# Patient Record
Sex: Female | Born: 1950 | Race: White | Hispanic: No | Marital: Married | Smoking: Never smoker
Health system: Southern US, Community
[De-identification: ages and names within clinical notes are randomized; demographics above are authoritative.]

## PROBLEM LIST (undated history)

## (undated) DIAGNOSIS — I10 Essential (primary) hypertension: Secondary | ICD-10-CM

## (undated) HISTORY — PX: ABDOMINAL HYSTERECTOMY: SHX81

## (undated) HISTORY — PX: OTHER SURGICAL HISTORY: SHX169

## (undated) HISTORY — DX: Essential (primary) hypertension: I10

## (undated) HISTORY — PX: CHOLECYSTECTOMY: SHX55

## (undated) HISTORY — PX: VESICOVAGINAL FISTULA CLOSURE W/ TAH: SUR271

---

## 2011-10-19 ENCOUNTER — Ambulatory Visit (HOSPITAL_COMMUNITY)
Admission: RE | Admit: 2011-10-19 | Discharge: 2011-10-19 | Disposition: A | Discharge status: Home / Self Care | Payer: BC Managed Care – PPO | Source: Ambulatory Visit | Attending: Family Medicine | Admitting: Family Medicine

## 2011-10-19 DIAGNOSIS — M79609 Pain in unspecified limb: Secondary | ICD-10-CM | POA: Insufficient documentation

## 2011-10-19 DIAGNOSIS — M7989 Other specified soft tissue disorders: Secondary | ICD-10-CM | POA: Insufficient documentation

## 2011-10-19 DIAGNOSIS — M79606 Pain in leg, unspecified: Secondary | ICD-10-CM

## 2011-10-19 NOTE — Progress Notes (Signed)
*  PRELIMINARY RESULTS* Vascular Ultrasound Left lower extremity venous duplex has been completed.  Preliminary findings: Left= No evidence of DVT. Incidental finding of Baker's cyst.  Farrel Demark, RDMS     10/19/2011, 4:31 PM

## 2011-11-02 ENCOUNTER — Other Ambulatory Visit: Payer: Self-pay | Admitting: Family Medicine

## 2011-11-02 DIAGNOSIS — R921 Mammographic calcification found on diagnostic imaging of breast: Secondary | ICD-10-CM

## 2011-11-25 ENCOUNTER — Ambulatory Visit
Admission: RE | Admit: 2011-11-25 | Discharge: 2011-11-25 | Disposition: A | Discharge status: Home / Self Care | Payer: 59 | Source: Ambulatory Visit | Attending: Family Medicine | Admitting: Family Medicine

## 2011-11-25 DIAGNOSIS — R921 Mammographic calcification found on diagnostic imaging of breast: Secondary | ICD-10-CM

## 2012-04-18 ENCOUNTER — Emergency Department (HOSPITAL_COMMUNITY)
Admission: EM | Admit: 2012-04-18 | Discharge: 2012-04-18 | Disposition: A | Discharge status: Home / Self Care | Payer: BC Managed Care – PPO | Attending: Emergency Medicine | Admitting: Emergency Medicine

## 2012-04-18 ENCOUNTER — Emergency Department (HOSPITAL_COMMUNITY): Payer: BC Managed Care – PPO

## 2012-04-18 ENCOUNTER — Encounter (HOSPITAL_COMMUNITY): Payer: Self-pay | Admitting: *Deleted

## 2012-04-18 DIAGNOSIS — S6992XA Unspecified injury of left wrist, hand and finger(s), initial encounter: Secondary | ICD-10-CM

## 2012-04-18 DIAGNOSIS — W19XXXA Unspecified fall, initial encounter: Secondary | ICD-10-CM | POA: Insufficient documentation

## 2012-04-18 DIAGNOSIS — Y998 Other external cause status: Secondary | ICD-10-CM | POA: Insufficient documentation

## 2012-04-18 DIAGNOSIS — IMO0002 Reserved for concepts with insufficient information to code with codable children: Secondary | ICD-10-CM

## 2012-04-18 DIAGNOSIS — S61409A Unspecified open wound of unspecified hand, initial encounter: Secondary | ICD-10-CM | POA: Insufficient documentation

## 2012-04-18 DIAGNOSIS — Y93K1 Activity, walking an animal: Secondary | ICD-10-CM | POA: Insufficient documentation

## 2012-04-18 MED ORDER — SULFAMETHOXAZOLE-TRIMETHOPRIM 800-160 MG PO TABS: 1.0000 | ORAL_TABLET | Freq: Two times a day (BID) | ORAL | Status: DC

## 2012-04-18 MED ORDER — TETANUS-DIPHTH-ACELL PERTUSSIS 5-2.5-18.5 LF-MCG/0.5 IM SUSP
0.5000 mL | Freq: Once | INTRAMUSCULAR | Status: AC
  Administered 2012-04-18: 0.5 mL via INTRAMUSCULAR
  Filled 2012-04-18: qty 0.5

## 2012-04-18 NOTE — ED Notes (Signed)
Patient given discharge instructions, information, prescriptions, and diet order. Patient states that they adequately understand discharge information given and to return to ED if symptoms return or worsen.     

## 2012-04-18 NOTE — ED Provider Notes (Addendum)
History     CSN: [id number]  Arrival date & time 04/18/12  1657   First MD Initiated Contact with Patient 04/18/12 1705      Chief Complaint  Patient presents with  . Hand Injury    (Consider location/radiation/quality/duration/timing/severity/associated sxs/prior treatment) HPI Comments: Patient presents with left hand injury after fall while walking three dogs. Patient states that when she fell she cut/landed on her left hand. Denies head trauma or LOC. Patient cannot recall her last tetanus shot. Denies join pain or numbness. Denies use of ASA or blood thinners. Denies other injuries or pain.  The history is provided by the patient. No language interpreter was used.    History reviewed. No pertinent past medical history.  Past Surgical History  Procedure Date  . Vesicovaginal fistula closure w/ tah   . Cesearan   . Cholecystectomy     No family history on file.  History  Substance Use Topics  . Smoking status: Not on file  . Smokeless tobacco: Not on file  . Alcohol Use:     OB History    Grav Para Term Preterm Abortions TAB SAB Ect Mult Living                  Review of Systems  Musculoskeletal: Negative for joint swelling and arthralgias.  Skin: Positive for wound.  Neurological: Negative for numbness.    Allergies  Review of patient's allergies indicates no known allergies.  Home Medications   Current Outpatient Rx  Name Route Sig Dispense Refill  . ADULT MULTIVITAMIN W/MINERALS CH Oral Take 1 tablet by mouth daily.      BP 137/91  Pulse 90  Temp 98.5 F (36.9 C) (Oral)  Resp 16  SpO2 95%  Physical Exam  Nursing note and vitals reviewed. Constitutional: She appears well-developed and well-nourished. No distress.  HENT:  Head: Normocephalic and atraumatic.  Mouth/Throat: Oropharynx is clear and moist.  Eyes: Conjunctivae normal and EOM are normal. No scleral icterus.  Neck: Normal range of motion. Neck supple.  Cardiovascular: Normal  rate, regular rhythm and normal heart sounds.   Pulmonary/Chest: Breath sounds normal.  Abdominal: Soft. Bowel sounds are normal. There is no tenderness.  Musculoskeletal: She exhibits edema. She exhibits no tenderness.       There is mild swelling/hematoma on the lateral portion of the left hand. Good range of motion. Strong radial pulse and good sensation.  Neurological: She is alert.  Skin: Skin is warm and dry.       There is a superficial .5 cm laceration on the ventral aspect of the left palm under the 5th digit.   LACERATION REPAIR Performed by: Pixie Casino Authorized by: Pixie Casino Consent: Verbal consent obtained. Risks and benefits: risks, benefits and alternatives were discussed Consent given by: patient Patient identity confirmed: provided demographic data Prepped and Draped in normal sterile fashion Wound explored  Laceration Location: ventral aspect of left palm  Laceration Length: 1/2 cm  No Foreign Bodies seen or palpated  Anesthesia: local infiltration  Local anesthetic: none  Anesthetic total: 0 ml  Irrigation method: syringe Amount of cleaning: standard  Skin closure: well approximated  Number of sutures: 0  Technique: dermabond used to close superficial laceration  Patient tolerance: Patient tolerated the procedure well with no immediate complications.  ED Course  Procedures (including critical care time)  Labs Reviewed - No data to display Dg Hand Complete Left  04/18/2012  *RADIOLOGY REPORT*  Clinical Data: Fall.  Pain and  swelling over the fifth metacarpal area.  LEFT HAND - COMPLETE 3+ VIEW  Comparison: None.  Findings: Soft tissue swelling is present over the dorsal and ulnar aspect of the hand at the level of the fifth metacarpal.  There is no fracture or dislocation.  The wrist is located.  IMPRESSION: Soft tissue swelling over the dorsal and ulnar aspect of the hand without an underlying fracture.   Original Report Authenticated By:  Jamesetta Orleans. MATTERN, M.D.      1. Injury of left hand   2. Laceration       MDM  Patient presented s/p fall with laceration and swelling. Patient did not report pain and declined pain medication. Imaging unremarkable for fracture or dislocation. Tetanus updated, laceration irrigated, cleaned, and dermabonded. Patient discharged with return precautions, ABX, and instructions on dermabond after care.       Pixie Casino, PA-C 04/18/12 2022  Pixie Casino, PA-C 05/14/12 1626

## 2012-04-18 NOTE — ED Notes (Signed)
Pt present with c/o left hand injury. Pt sts she was walking 3 dogs and fell onto her left hand. Puncture wound to left hand and swelling to back of left hand. Small abrasion to left forehead, above eye. Pt denies LOC. Pt sts she is unsure of her last tetanus shot.

## 2012-04-22 NOTE — ED Provider Notes (Signed)
Medical screening examination/treatment/procedure(s) were performed by non-physician practitioner and as supervising physician I was immediately available for consultation/collaboration.  Chirsty Armistead T Rhonna Holster, MD 04/22/12 1014 

## 2012-05-17 NOTE — ED Provider Notes (Signed)
Medical screening examination/treatment/procedure(s) were performed by non-physician practitioner and as supervising physician I was immediately available for consultation/collaboration.  Toy Baker, MD 05/17/12 [phone]

## 2013-01-01 HISTORY — PX: KNEE SURGERY: SHX244

## 2013-08-02 ENCOUNTER — Emergency Department (HOSPITAL_COMMUNITY)
Admission: EM | Admit: 2013-08-02 | Discharge: 2013-08-02 | Disposition: A | Discharge status: Home / Self Care | Payer: BC Managed Care – PPO | Attending: Emergency Medicine | Admitting: Emergency Medicine

## 2013-08-02 ENCOUNTER — Emergency Department (HOSPITAL_COMMUNITY): Payer: BC Managed Care – PPO

## 2013-08-02 ENCOUNTER — Encounter (HOSPITAL_COMMUNITY): Payer: Self-pay | Admitting: Emergency Medicine

## 2013-08-02 DIAGNOSIS — Z79899 Other long term (current) drug therapy: Secondary | ICD-10-CM | POA: Insufficient documentation

## 2013-08-02 DIAGNOSIS — R0789 Other chest pain: Secondary | ICD-10-CM | POA: Insufficient documentation

## 2013-08-02 DIAGNOSIS — R42 Dizziness and giddiness: Secondary | ICD-10-CM | POA: Insufficient documentation

## 2013-08-02 DIAGNOSIS — E669 Obesity, unspecified: Secondary | ICD-10-CM | POA: Insufficient documentation

## 2013-08-02 DIAGNOSIS — R079 Chest pain, unspecified: Secondary | ICD-10-CM

## 2013-08-02 DIAGNOSIS — Z7982 Long term (current) use of aspirin: Secondary | ICD-10-CM | POA: Insufficient documentation

## 2013-08-02 LAB — COMPREHENSIVE METABOLIC PANEL WITH GFR
ALT: 14 U/L (ref 0–35)
AST: 17 U/L (ref 0–37)
Albumin: 3.5 g/dL (ref 3.5–5.2)
Alkaline Phosphatase: 91 U/L (ref 39–117)
BUN: 13 mg/dL (ref 6–23)
CO2: 26 meq/L (ref 19–32)
Calcium: 9.5 mg/dL (ref 8.4–10.5)
Chloride: 100 meq/L (ref 96–112)
Creatinine, Ser: 0.66 mg/dL (ref 0.50–1.10)
GFR calc Af Amer: >90 mL/min
GFR calc non Af Amer: >90 mL/min
Glucose, Bld: 178 mg/dL — ABNORMAL HIGH (ref 70–99)
Potassium: 3.8 meq/L (ref 3.7–5.3)
Sodium: 139 meq/L (ref 137–147)
Total Bilirubin: 0.3 mg/dL (ref 0.3–1.2)
Total Protein: 6.9 g/dL (ref 6.0–8.3)

## 2013-08-02 LAB — CBC
HCT: 44.6 % (ref 36.0–46.0)
HEMOGLOBIN: 15.1 g/dL — AB (ref 12.0–15.0)
MCH: 29.8 pg (ref 26.0–34.0)
MCHC: 33.9 g/dL (ref 30.0–36.0)
MCV: 88 fL (ref 78.0–100.0)
PLATELETS: 319 10*3/uL (ref 150–400)
RBC: 5.07 MIL/uL (ref 3.87–5.11)
RDW: 12.5 % (ref 11.5–15.5)
WBC: 6.6 10*3/uL (ref 4.0–10.5)

## 2013-08-02 LAB — APTT: aPTT: 26 s (ref 24–37)

## 2013-08-02 LAB — POCT I-STAT TROPONIN I
TROPONIN I, POC: 0 ng/mL (ref 0.00–0.08)
Troponin i, poc: 0 ng/mL (ref 0.00–0.08)

## 2013-08-02 LAB — PROTIME-INR
INR: 0.88 (ref 0.00–1.49)
Prothrombin Time: 11.8 seconds (ref 11.6–15.2)

## 2013-08-02 MED ORDER — PANTOPRAZOLE SODIUM 20 MG PO TBEC: 20.0000 mg | DELAYED_RELEASE_TABLET | Freq: Every day | ORAL | Status: DC

## 2013-08-02 MED ORDER — GI COCKTAIL ~~LOC~~
30.0000 mL | Freq: Once | ORAL | Status: AC
  Administered 2013-08-02: 30 mL via ORAL
  Filled 2013-08-02: qty 30

## 2013-08-02 MED ORDER — ASPIRIN 325 MG PO TABS: 325.0000 mg | ORAL_TABLET | Freq: Every day | ORAL | Status: AC

## 2013-08-02 MED ORDER — SODIUM CHLORIDE 0.9 % IV SOLN
1000.0000 mL | INTRAVENOUS | Status: DC
  Administered 2013-08-02: 1000 mL via INTRAVENOUS

## 2013-08-02 NOTE — ED Notes (Signed)
Pt reports that she was having a conversation on Tuesday and everything started to sway in her vision while sitting still, pt reports that the dizziness has continued intermittently. Pt states that today she began to have centralized chest pressure, denies ShOB, states that she had similar episodes in the past and had MRIs that were negative. Pt a&o x4, ambulatory to triage.

## 2013-08-02 NOTE — Discharge Instructions (Signed)
Chest Pain (Nonspecific) °It is often hard to give a specific diagnosis for the cause of chest pain. There is always a chance that your pain could be related to something serious, such as a heart attack or a blood clot in the lungs. You need to follow up with your caregiver for further evaluation. °CAUSES  °· Heartburn. °· Pneumonia or bronchitis. °· Anxiety or stress. °· Inflammation around your heart (pericarditis) or lung (pleuritis or pleurisy). °· A blood clot in the lung. °· A collapsed lung (pneumothorax). It can develop suddenly on its own (spontaneous pneumothorax) or from injury (trauma) to the chest. °· Shingles infection (herpes zoster virus). °The chest wall is composed of bones, muscles, and cartilage. Any of these can be the source of the pain. °· The bones can be bruised by injury. °· The muscles or cartilage can be strained by coughing or overwork. °· The cartilage can be affected by inflammation and become sore (costochondritis). °DIAGNOSIS  °Lab tests or other studies, such as X-rays, electrocardiography, stress testing, or cardiac imaging, may be needed to find the cause of your pain.  °TREATMENT  °· Treatment depends on what may be causing your chest pain. Treatment may include: °· Acid blockers for heartburn. °· Anti-inflammatory medicine. °· Pain medicine for inflammatory conditions. °· Antibiotics if an infection is present. °· You may be advised to change lifestyle habits. This includes stopping smoking and avoiding alcohol, caffeine, and chocolate. °· You may be advised to keep your head raised (elevated) when sleeping. This reduces the chance of acid going backward from your stomach into your esophagus. °· Most of the time, nonspecific chest pain will improve within 2 to 3 days with rest and mild pain medicine. °HOME CARE INSTRUCTIONS  °· If antibiotics were prescribed, take your antibiotics as directed. Finish them even if you start to feel better. °· For the next few days, avoid physical  activities that bring on chest pain. Continue physical activities as directed. °· Do not smoke. °· Avoid drinking alcohol. °· Only take over-the-counter or prescription medicine for pain, discomfort, or fever as directed by your caregiver. °· Follow your caregiver's suggestions for further testing if your chest pain does not go away. °· Keep any follow-up appointments you made. If you do not go to an appointment, you could develop lasting (chronic) problems with pain. If there is any problem keeping an appointment, you must call to reschedule. °SEEK MEDICAL CARE IF:  °· You think you are having problems from the medicine you are taking. Read your medicine instructions carefully. °· Your chest pain does not go away, even after treatment. °· You develop a rash with blisters on your chest. °SEEK IMMEDIATE MEDICAL CARE IF:  °· You have increased chest pain or pain that spreads to your arm, neck, jaw, back, or abdomen. °· You develop shortness of breath, an increasing cough, or you are coughing up blood. °· You have severe back or abdominal pain, feel nauseous, or vomit. °· You develop severe weakness, fainting, or chills. °· You have a fever. °THIS IS AN EMERGENCY. Do not wait to see if the pain will go away. Get medical help at once. Call your local emergency services (911 in [place] Do not drive yourself to the hospital. °MAKE SURE YOU:  °· Understand these instructions. °· Will watch your condition. °· Will get help right away if you are not doing well or get worse. °Document Released: 03/30/2005 Document Revised: 09/12/2011 Document Reviewed: 01/24/2008 °ExitCare® Patient Information ©2014 [employer]   [employer]. ° °

## 2013-08-02 NOTE — ED Provider Notes (Signed)
CSN: [id number]     Arrival date & time 08/02/13  4098 History   First MD Initiated Contact with Patient 08/02/13 [phone]     Chief Complaint  Patient presents with  . Chest Pain    Patient is a 63 y.o. female presenting with chest pain. The history is provided by the patient.  Chest Pain Pain location:  Substernal area Pain quality: pressure   Pain radiates to:  Does not radiate Pain radiates to the back: no   Pain severity:  Mild Onset quality: She noticed it when she woke up this morning. Timing:  Intermittent Progression:  Waxing and waning Chronicity:  New Relieved by:  Nothing Exacerbated by: not increased by movement, breathing. Ineffective treatments:  Aspirin Associated symptoms: dizziness (On and off the past few days)   Associated symptoms: no abdominal pain, no cough, no diaphoresis, no fever, no nausea, no near-syncope and no shortness of breath   Risk factors: no coronary artery disease, no high cholesterol, no hypertension, no prior DVT/PE and no smoking   Risk factors comment:  Brother MI in his 66s, deceased Pt did have a stress test a few years ago that was normal.  It was done after her brother died of an MI. She does not exercise regularly but has not had noticed any trouble walking up and down stairs.  History reviewed. No pertinent past medical history. Past Surgical History  Procedure Laterality Date  . Vesicovaginal fistula closure w/ tah    . Cesearan    . Cholecystectomy    . Abdominal hysterectomy    . Knee surgery Left 01/2013    Torn Meniscus   History reviewed. No pertinent family history. History  Substance Use Topics  . Smoking status: Never Smoker   . Smokeless tobacco: Never Used  . Alcohol Use: No   OB History   Grav Para Term Preterm Abortions TAB SAB Ect Mult Living                 Review of Systems  Constitutional: Negative for fever and diaphoresis.  Respiratory: Negative for cough and shortness of breath.   Cardiovascular:  Positive for chest pain. Negative for near-syncope.  Gastrointestinal: Negative for nausea and abdominal pain.  Neurological: Positive for dizziness (On and off the past few days).  All other systems reviewed and are negative.    Allergies  Review of patient's allergies indicates no known allergies.  Home Medications   Current Outpatient Rx  Name  Route  Sig  Dispense  Refill  . Multiple Vitamin (MULTIVITAMIN WITH MINERALS) TABS   Oral   Take 1 tablet by mouth daily.         Marland Kitchen aspirin 325 MG tablet   Oral   Take 1 tablet (325 mg total) by mouth daily.   30 tablet   0   . pantoprazole (PROTONIX) 20 MG tablet   Oral   Take 1 tablet (20 mg total) by mouth daily.   14 tablet   0    BP 154/82  Pulse 74  Temp(Src) 98 F (36.7 C) (Oral)  Resp 12  Ht 5\' 6"  (1.676 m)  Wt 245 lb (111.131 kg)  BMI 39.56 kg/m2  SpO2 94% Physical Exam  Nursing note and vitals reviewed. Constitutional: She appears well-developed and well-nourished. No distress.  Obese   HENT:  Head: Normocephalic and atraumatic.  Right Ear: External ear normal.  Left Ear: External ear normal.  Eyes: Conjunctivae are normal. Right eye exhibits no  discharge. Left eye exhibits no discharge. No scleral icterus.  Neck: Neck supple. No tracheal deviation present.  Cardiovascular: Normal rate, regular rhythm and intact distal pulses.   Pulmonary/Chest: Effort normal and breath sounds normal. No stridor. No respiratory distress. She has no wheezes. She has no rales.  Abdominal: Soft. Bowel sounds are normal. She exhibits no distension. There is no tenderness. There is no rebound and no guarding.  Musculoskeletal: She exhibits no edema and no tenderness.  Neurological: She is alert. She has normal strength. No cranial nerve deficit (no facial droop, extraocular movements intact, no slurred speech) or sensory deficit. She exhibits normal muscle tone. She displays no seizure activity. Coordination normal.  Skin: Skin  is warm and dry. No rash noted.  Psychiatric: She has a normal mood and affect.    ED Course  Procedures (including critical care time) Labs Review Labs Reviewed  CBC - Abnormal; Notable for the following:    Hemoglobin 15.1 (*)    All other components within normal limits  COMPREHENSIVE METABOLIC PANEL - Abnormal; Notable for the following:    Glucose, Bld 178 (*)    All other components within normal limits  PROTIME-INR  APTT  POCT I-STAT TROPONIN I  POCT I-STAT TROPONIN I   Imaging Review Dg Chest 2 View  08/02/2013   CLINICAL DATA:  Two day history of dizziness and chest discomfort  EXAM: CHEST  2 VIEW  COMPARISON:  None.  FINDINGS: The lungs are well-expanded and clear. The cardiac silhouette is mildly enlarged. The pulmonary vascularity is prominent centrally but there is no cephalization of the pulmonary vascular pattern. No pulmonary interstitial or alveolar edema is demonstrated. There is no pleural effusion or pneumothorax. There is tortuosity of the descending thoracic aorta. The observed portions of the bony thorax appear normal.  IMPRESSION: There is no evidence of pneumonia nor CHF or other acute cardiopulmonary disease.   Electronically Signed   By: David  Swazil[place]   On: 08/02/2013 10:50   (verbal report normal)  EKG Interpretation    Date/Time:  Friday August 02 2013 07:05:40 EST Ventricular Rate:  98 PR Interval:  182 QRS Duration: 80 QT Interval:  368 QTC Calculation: 470 R Axis:   -37 Text Interpretation:  Sinus rhythm poor r wave progression No previous tracing Confirmed by Wille Aubuchon  MD-J, Gavrielle Streck ([id number]) on 08/02/2013 7:13:20 AM           Medications  0.9 %  sodium chloride infusion (1,000 mLs Intravenous New Bag/Given 08/02/13 0802)  gi cocktail (Maalox,Lidocaine,Donnatal) (30 mLs Oral Given 08/02/13 0802)    1056  Feeling better.  No pain or pressure. MDM   1. Chest pain     Suspect her symptoms are related to GERD.   TIMI score zero.  Pt has had two  sets enzymes, no cardiac risk factors except family history.  Pt has had negative stress test previously but was several years ago.  Discussed hospitalization vs outpatient follow up and probable repeat stress testing.  Pt comfortable with outpatient follow up.  Will have her take an aspirin  Daily and rx ppi.  Instructed to return to the ED for recurrent symptoms.   Celene KrasJon R Isaiah Cianci, MD 08/02/13 [phone]

## 2013-08-21 ENCOUNTER — Ambulatory Visit: Payer: BC Managed Care – PPO | Admitting: Physician Assistant

## 2013-09-24 ENCOUNTER — Ambulatory Visit: Payer: BC Managed Care – PPO | Admitting: Physician Assistant

## 2013-11-26 ENCOUNTER — Other Ambulatory Visit: Payer: Self-pay

## 2013-11-26 DIAGNOSIS — Z1231 Encounter for screening mammogram for malignant neoplasm of breast: Secondary | ICD-10-CM

## 2013-12-13 ENCOUNTER — Ambulatory Visit
Admission: RE | Admit: 2013-12-13 | Discharge: 2013-12-13 | Disposition: A | Discharge status: Home / Self Care | Payer: Managed Care, Other (non HMO) | Source: Ambulatory Visit

## 2013-12-13 DIAGNOSIS — Z1231 Encounter for screening mammogram for malignant neoplasm of breast: Secondary | ICD-10-CM

## 2014-07-31 ENCOUNTER — Telehealth: Payer: Self-pay | Admitting: Internal Medicine

## 2014-07-31 NOTE — Telephone Encounter (Signed)
[facility] (Dr Virl Sonammy Boyd) for appointment on 08/21/14 with Dr Rennis GoldenHilty.  [facility] for Dr Blanchie DessertHilty's schedule on 08/21/14.  lp

## 2014-08-21 ENCOUNTER — Encounter: Payer: Self-pay | Admitting: Internal Medicine

## 2014-08-21 ENCOUNTER — Ambulatory Visit (INDEPENDENT_AMBULATORY_CARE_PROVIDER_SITE_OTHER): Payer: BLUE CROSS/BLUE SHIELD | Admitting: Internal Medicine

## 2014-08-21 VITALS — BP 138/96 | HR 82 | Ht 67.0 in | Wt 255.9 lb

## 2014-08-21 DIAGNOSIS — R002 Palpitations: Secondary | ICD-10-CM | POA: Diagnosis not present

## 2014-08-21 DIAGNOSIS — Z8249 Family history of ischemic heart disease and other diseases of the circulatory system: Secondary | ICD-10-CM

## 2014-08-21 DIAGNOSIS — I10 Essential (primary) hypertension: Secondary | ICD-10-CM | POA: Insufficient documentation

## 2014-08-21 DIAGNOSIS — R9431 Abnormal electrocardiogram [ECG] [EKG]: Secondary | ICD-10-CM

## 2014-08-21 DIAGNOSIS — R0609 Other forms of dyspnea: Secondary | ICD-10-CM

## 2014-08-21 NOTE — Progress Notes (Signed)
OFFICE NOTE  Chief Complaint:  Palpitations, shortness of breath  Primary Care Physician: Verlon AuBoyd, Tammy Lamonica, MD  HPI:  Stephanie Barnett is a pleasant 64 year old female with past medical history significant for hypertension as well as a family history of heart disease whose brother died of a heart attack at age 64. She has coronary risk factors that also include morbid obesity with a BMI of 40. Recently she's been having some worsening palpitations or feeling of fluttering in her chest. She says it can occur almost any time and is not sterilely worse with exertion or relieved by rest. She does have a history of reflux. An EKG was performed in the office today which is abnormal demonstrating poor R-wave progression across the precordium as well as Q waves inferiorly into 3 and aVF concerning for either anterior or inferior infarcts. She denies any history of heart disease or prior heart attack. She said that [place], probably more than 10 years ago.  PMHx:  Past Medical History  Diagnosis Date  . Hypertension     Past Surgical History  Procedure Laterality Date  . Vesicovaginal fistula closure w/ tah    . Cesearan    . Cholecystectomy    . Abdominal hysterectomy    . Knee surgery Left 01/2013    Torn Meniscus    FAMHx:  Family History  Problem Relation Age of Onset  . Heart attack Brother 56  . Pulmonary embolism Father     COD after total hip replacment  . Heart disease Paternal Grandfather   . Heart disease Paternal Grandmother   . Hypertension Mother   . Arthritis/Rheumatoid Brother     SOCHx:   reports that she has never smoked. She has never used smokeless tobacco. She reports that she does not drink alcohol or use illicit drugs.  ALLERGIES:  No Known Allergies  ROS: A comprehensive review of systems was negative except for: Cardiovascular: positive for dyspnea and palpitations  HOME  MEDS: Current Outpatient Prescriptions  Medication Sig Dispense Refill  . aspirin 325 MG tablet Take 1 tablet (325 mg total) by mouth daily. 30 tablet 0  . losartan-hydrochlorothiazide (HYZAAR) 50-12.5 MG per tablet Take 1 tablet by mouth daily.    . Multiple Vitamin (MULTIVITAMIN WITH MINERALS) TABS Take 1 tablet by mouth daily.     No current facility-administered medications for this visit.    LABS/IMAGING: No results found for this or any previous visit (from the past 48 hour(s)). No results found.  VITALS: BP 138/96 mmHg  Pulse 82  Ht 5\' 7"  (1.702 m)  Wt 255 lb 14.4 oz (116.075 kg)  BMI 40.07 kg/m2  EXAM: General appearance: alert, no distress and morbidly obese Neck: no carotid bruit and no JVD Lungs: clear to auscultation bilaterally Heart: regular rate and rhythm, S1, S2 normal, no murmur, click, rub or gallop Abdomen: soft, non-tender; bowel sounds normal; no masses,  no organomegaly Extremities: extremities normal, atraumatic, no cyanosis or edema Pulses: 2+ and symmetric Skin: Skin color, texture, turgor normal. No rashes or lesions Neurologic: Grossly normal Psych: Normal  EKG: Normal sinus rhythm at 82, possible inferior and anterior infarcts  ASSESSMENT: 1. Dyspnea and exertion 2. Palpitations 3. Abnormal EKG with possible history of prior infarcts 4. Morbid obesity 5. Hypertension 6. Family history of premature coronary disease   PLAN: 1.   Stephanie Barnett has a number of cardiac risk factors including strong family history  of coronary disease, hypertension and morbid obesity. Her EKG is abnormal suggesting possible prior inferior and anterior infarcts, however she has no recollection of these events and no known coronary disease. She's had remote stress testing more than 10 years ago which is not relevant at this point. She is also describing palpitations. I would recommend we fitted her with a monitor for 2 weeks as her symptoms occur several times a week.  This will help [place] to determine the cause of her palpitations. I also recommend a nuclear stress test to evaluate for ischemia and/or prior infarct. Plan to see her back to discuss results of the studies in a few weeks.   Thanks for the kind referral.   Chrystie Nose, MD, [facility] Attending Cardiologist [facility]  Celso Granja C 08/21/2014, 12:47 PM

## 2014-08-21 NOTE — Patient Instructions (Signed)
Your physician has requested that you have an exercise stress myoview. For further information please visit [email or web address]. Please follow instruction sheet, as given.  Your physician recommends that you schedule a follow-up appointment after your stress test & monitor.   Your Doctor has ordered you to wear a heart monitor. You will wear this for 14 days.   TIPS -  REMINDERS 1. The sensor is the lanyard that is worn around your neck every day - this is powered by a battery that needs to be changed every day 2. The monitor is the device that allows you to record symptoms - this will need to be charged daily 3. The sensor & monitor need to be within 100 feet of each other at all times 4. The sensor connects to the electrodes (stickers) - these should be changed every 24-48 hours (you do not have to remove them when you bathe, just make sure they are dry when you connect it back to the sensor 5. If you need more supplies (electrodes, batteries), please call the 1-800 # on the back of the pamphlet and [employer] will mail you more supplies 6. If your skin becomes sensitive, please try the sample pack of sensitive skin electrodes (the white packet in your silver box) and call [employer] to have them mail you more of these type of electrodes 7. When you are finish wearing the monitor, please place all supplies back in the silver box, place the silver box in the pre-packaged UPS bag and drop off at [employer] or call them so they can come pick it up   Cardiac Event Monitoring A cardiac event monitor is a small recording device used to help detect abnormal heart rhythms (arrhythmias). The monitor is used to record heart rhythm when noticeable symptoms such as the following occur:  Fast heartbeats (palpitations), such as heart racing or fluttering.  Dizziness.  Fainting or light-headedness.  Unexplained weakness. The monitor is wired to two electrodes placed on your chest. Electrodes are flat, sticky  disks that attach to your skin. The monitor can be worn for up to 30 days. You will wear the monitor at all times, except when bathing.  HOW TO USE YOUR CARDIAC EVENT MONITOR A technician will prepare your chest for the electrode placement. The technician will show you how to place the electrodes, how to work the monitor, and how to replace the batteries. Take time to practice using the monitor before you leave the office. Make sure you understand how to send the information from the monitor to your health care provider. This requires a telephone with a landline, not a cell phone. You need to:  Wear your monitor at all times, except when you are in water:  Do not get the monitor wet.  Take the monitor off when bathing. Do not swim or use a hot tub with it on.  Keep your skin clean. Do not put body lotion or moisturizer on your chest.  Change the electrodes daily or any time they stop sticking to your skin. You might need to use tape to keep them on.  It is possible that your skin under the electrodes could become irritated. To keep this from happening, try to put the electrodes in slightly different places on your chest. However, they must remain in the area under your left breast and in the upper right section of your chest.  Make sure the monitor is safely clipped to your clothing or in a location close to your body  that your health care provider recommends.  Press the button to record when you feel symptoms of heart trouble, such as dizziness, weakness, light-headedness, palpitations, thumping, shortness of breath, unexplained weakness, or a fluttering or racing heart. The monitor is always on and records what happened slightly before you pressed the button, so do not worry about being too late to get good information.  Keep a diary of your activities, such as walking, doing chores, and taking medicine. It is especially important to note what you were doing when you pushed the button to record  your symptoms. This will help your health care provider determine what might be contributing to your symptoms. The information stored in your monitor will be reviewed by your health care provider alongside your diary entries.  Send the recorded information as recommended by your health care provider. It is important to understand that it will take some time for your health care provider to process the results.  Change the batteries as recommended by your health care provider. SEEK IMMEDIATE MEDICAL CARE IF:   You have chest pain.  You have extreme difficulty breathing or shortness of breath.  You develop a very fast heartbeat that persists.  You develop dizziness that does not go away.  You faint or constantly feel you are about to faint. Document Released: 03/29/2008 Document Revised: 11/04/2013 Document Reviewed: 12/17/2012 [facility] Patient Information 2015 [place], [place]. This information is not intended to replace advice given to you by your health care provider. Make sure you discuss any questions you have with your health care provider.

## 2014-09-02 ENCOUNTER — Telehealth (HOSPITAL_COMMUNITY): Payer: Self-pay

## 2014-09-02 NOTE — Telephone Encounter (Signed)
Encounter complete. 

## 2014-09-04 ENCOUNTER — Ambulatory Visit (HOSPITAL_COMMUNITY)
Admission: RE | Admit: 2014-09-04 | Discharge: 2014-09-04 | Disposition: A | Discharge status: Home / Self Care | Payer: BLUE CROSS/BLUE SHIELD | Source: Ambulatory Visit | Attending: Cardiology | Admitting: Cardiology

## 2014-09-04 DIAGNOSIS — R002 Palpitations: Secondary | ICD-10-CM | POA: Insufficient documentation

## 2014-09-04 DIAGNOSIS — I10 Essential (primary) hypertension: Secondary | ICD-10-CM

## 2014-09-04 DIAGNOSIS — R9431 Abnormal electrocardiogram [ECG] [EKG]: Secondary | ICD-10-CM

## 2014-09-04 DIAGNOSIS — E669 Obesity, unspecified: Secondary | ICD-10-CM | POA: Insufficient documentation

## 2014-09-04 DIAGNOSIS — R0609 Other forms of dyspnea: Secondary | ICD-10-CM | POA: Diagnosis not present

## 2014-09-04 DIAGNOSIS — Z8249 Family history of ischemic heart disease and other diseases of the circulatory system: Secondary | ICD-10-CM | POA: Insufficient documentation

## 2014-09-04 DIAGNOSIS — R42 Dizziness and giddiness: Secondary | ICD-10-CM | POA: Diagnosis not present

## 2014-09-04 MED ORDER — TECHNETIUM TC 99M SESTAMIBI GENERIC - CARDIOLITE
31.9000 | Freq: Once | INTRAVENOUS | Status: AC | PRN
  Administered 2014-09-04: 31.9 via INTRAVENOUS

## 2014-09-04 NOTE — Procedures (Addendum)
[place] [facility] [address] [place] [id number] [phone]  Cardiology Nuclear Med Study  Hoy FinlayDorothy Barnett is a 64 y.o. female     MRN : [id number]     DOB: 11/14/1950  Procedure Date: 09/04/2014  Nuclear Med Background Indication for Stress Test:  Evaluation for Ischemia and Abnormal EKG History:  No prior cardiac or respiratory history reported;No prior NUC MPI for comparison. Cardiac Risk Factors: Family History - CAD, Hypertension and Obesity  Symptoms:  Dizziness, DOE, Light-Headedness and Palpitations   Nuclear Pre-Procedure Caffeine/Decaff Intake:  7:00pm NPO After: 5:00am   IV Site: R Wrist  IV 0.9% NS with Angio Cath:  22g  Chest Size (in):  n/a IV Started by: Berdie OgrenAmanda Wease, RN  Height: 5\' 7"  (1.702 m)  Cup Size: F  BMI:  Body mass index is 39.93 kg/(m^2). Weight:  255 lb (115.667 kg)   Tech Comments:  n/a    Nuclear Med Study 1 or 2 day study: 2 day  Stress Test Type:  Lexiscan  Order Authorizing Provider:  Zoila ShutterKenneth Hays Dunnigan, MD   Resting Radionuclide: Technetium 2578m Sestamibi  Resting Radionuclide Dose: 31.9 mCi   Stress Radionuclide:  Technetium 8978m Sestamibi  Stress Radionuclide Dose: 32.0 mCi           Stress Protocol Rest HR: 96 Stress HR: 146  Rest BP: 166/101 Stress BP: 212/106  Exercise Time (min): 5:16 METS: 7.0   Predicted Max HR: 157 bpm % Max HR: 92.99 bpm Rate Pressure Product: [id number]  Dose of Adenosine (mg):  n/a Dose of Lexiscan: n/a mg  Dose of Atropine (mg): n/a Dose of Dobutamine: n/a mcg/kg/min (at max HR)  Stress Test Technologist: Esperanza Sheetserry-Marie Martin, CCT Nuclear Technologist: Gonzella LexPam Phillips, CNMT   Rest Procedure:  Myocardial perfusion imaging was performed at rest 45 minutes following the intravenous administration of Technetium 3278m Sestamibi. Stress Procedure:  The patient performed treadmill exercise using a Bruce  Protocol for 5:16 minutes. The patient stopped due to SOB and denied any  chest pain.  There were no significant ST-T wave changes.  Technetium 2578m Sestamibi was injected IV at peak exercise and myocardial perfusion imaging was performed after a brief delay.  Transient Ischemic Dilatation (Normal <1.22):  0.72  QGS EDV:  51 ml QGS ESV:  12 ml LV Ejection Fraction: 76%  Rest ECG: NSR - Normal EKG  Stress ECG: There are scattered PVCs.  QPS Raw Data Images:  Mild breast attenuation; normal left ventricular size. Stress Images:  Apical lateral perfusion defect Rest Images:  Mild apical lateral perfusion defect Subtraction (SDS):  There is a fixed anteriour defect that is most consistent with breast attenuation.  Impression Exercise Capacity:  Good exercise capacity. BP Response:  Hypertensive blood pressure response. Clinical Symptoms:  There is dyspnea. ECG Impression:  No significant ST segment change suggestive of ischemia. Comparison with Prior Nuclear Study: No previous nuclear study performed  Overall Impression:  Low risk stress nuclear study with small, mild intensity apical lateral mostly fixed defect which is likely breast attenuation artifact. Hypertensive response to exercise - no change in PVC''s with exercise.  LV Wall Motion:  NL LV Function; NL Wall Motion; EF 76%  Chrystie NoseKenneth C. Deagen Krass, MD, [facility] Board Certified in Nuclear Cardiology Attending Cardiologist [facility]  Chrystie NoseHILTY,Ilka Lovick C, MD  09/05/2014 11:46 AM

## 2014-09-05 ENCOUNTER — Ambulatory Visit (HOSPITAL_COMMUNITY)
Admission: RE | Admit: 2014-09-05 | Discharge: 2014-09-05 | Disposition: A | Discharge status: Home / Self Care | Payer: BLUE CROSS/BLUE SHIELD | Source: Ambulatory Visit | Attending: Cardiovascular Disease | Admitting: Cardiovascular Disease

## 2014-09-05 DIAGNOSIS — R9431 Abnormal electrocardiogram [ECG] [EKG]: Secondary | ICD-10-CM | POA: Insufficient documentation

## 2014-09-05 DIAGNOSIS — I10 Essential (primary) hypertension: Secondary | ICD-10-CM | POA: Diagnosis not present

## 2014-09-05 DIAGNOSIS — Z8249 Family history of ischemic heart disease and other diseases of the circulatory system: Secondary | ICD-10-CM

## 2014-09-05 DIAGNOSIS — R002 Palpitations: Secondary | ICD-10-CM | POA: Insufficient documentation

## 2014-09-05 DIAGNOSIS — R42 Dizziness and giddiness: Secondary | ICD-10-CM | POA: Insufficient documentation

## 2014-09-05 DIAGNOSIS — R0600 Dyspnea, unspecified: Secondary | ICD-10-CM | POA: Diagnosis not present

## 2014-09-05 MED ORDER — TECHNETIUM TC 99M SESTAMIBI GENERIC - CARDIOLITE
32.0000 | Freq: Once | INTRAVENOUS | Status: AC | PRN
  Administered 2014-09-05: 32 via INTRAVENOUS

## 2014-09-25 ENCOUNTER — Encounter: Payer: Self-pay | Admitting: Internal Medicine

## 2014-09-25 ENCOUNTER — Ambulatory Visit (INDEPENDENT_AMBULATORY_CARE_PROVIDER_SITE_OTHER): Payer: BLUE CROSS/BLUE SHIELD | Admitting: Internal Medicine

## 2014-09-25 VITALS — BP 130/96 | HR 88 | Ht 67.0 in | Wt 255.8 lb

## 2014-09-25 DIAGNOSIS — R002 Palpitations: Secondary | ICD-10-CM | POA: Diagnosis not present

## 2014-09-25 DIAGNOSIS — I10 Essential (primary) hypertension: Secondary | ICD-10-CM | POA: Diagnosis not present

## 2014-09-25 DIAGNOSIS — R0609 Other forms of dyspnea: Secondary | ICD-10-CM | POA: Diagnosis not present

## 2014-09-25 MED ORDER — NEBIVOLOL HCL 5 MG PO TABS: 5.0000 mg | ORAL_TABLET | Freq: Every day | ORAL | Status: DC

## 2014-09-25 NOTE — Patient Instructions (Addendum)
Your physician has recommended you make the following change in your medication: START bystolic 5mg  once daily  Medication samples have been provided to the patient.  Drug name: bystolic 5  Qty: 28  LOT: [id number]  Exp.Date: 09/2016    Your physician wants you to follow-up in: 6 months with Dr. Rennis GoldenHilty. You will receive a reminder letter in the mail two months in advance. If you don't receive a letter, please call our office to schedule the follow-up appointment.

## 2014-09-25 NOTE — Progress Notes (Signed)
OFFICE NOTE  Chief Complaint:  Follow-up stress test and monitor  Primary Care Physician: Verlon AuBoyd, Tammy Lamonica, MD  HPI:  Stephanie Barnett is a pleasant 64 year old female with past medical history significant for hypertension as well as a family history of heart disease whose brother died of a heart attack at age 64. She has coronary risk factors that also include morbid obesity with a BMI of 40. Recently she's been having some worsening palpitations or feeling of fluttering in her chest. She says it can occur almost any time and is not sterilely worse with exertion or relieved by rest. She does have a history of reflux. An EKG was performed in the office today which is abnormal demonstrating poor R-wave progression across the precordium as well as Q waves inferiorly into 3 and aVF concerning for either anterior or inferior infarcts. She denies any history of heart disease or prior heart attack. She said that she underwent stress [place], probably more than 10 years ago.  I saw Stephanie Barnett back in the office today. She did very well on her exercise nuclear stress test. Reportedly she had a hypertensive response to exercise. Perfusion was normal and EF was normal at 76%. She wore a Holter monitor which demonstrated some periods of sinus tachycardia as well as PVCs and one ventricular couplet. This may or may not be causing her symptoms. It appears her blood pressure still not at goal with blood pressure today 130/96.  PMHx:  Past Medical History  Diagnosis Date  . Hypertension     Past Surgical History  Procedure Laterality Date  . Vesicovaginal fistula closure w/ tah    . Cesearan    . Cholecystectomy    . Abdominal hysterectomy    . Knee surgery Left 01/2013    Torn Meniscus    FAMHx:  Family History  Problem Relation Age of Onset  . Heart attack Brother 56  . Pulmonary embolism Father     COD after total hip replacment  . Heart  disease Paternal Grandfather   . Heart disease Paternal Grandmother   . Hypertension Mother   . Arthritis/Rheumatoid Brother     SOCHx:   reports that she has never smoked. She has never used smokeless tobacco. She reports that she does not drink alcohol or use illicit drugs.  ALLERGIES:  No Known Allergies  ROS: A comprehensive review of systems was negative except for: Cardiovascular: positive for dyspnea and palpitations  HOME MEDS: Current Outpatient Prescriptions  Medication Sig Dispense Refill  . aspirin 325 MG tablet Take 1 tablet (325 mg total) by mouth daily. 30 tablet 0  . furosemide (LASIX) 20 MG tablet Take 20 mg by mouth daily as needed.  1  . losartan-hydrochlorothiazide (HYZAAR) 50-12.5 MG per tablet Take 1 tablet by mouth daily.    . Multiple Vitamin (MULTIVITAMIN WITH MINERALS) TABS Take 1 tablet by mouth daily.    . nebivolol (BYSTOLIC) 5 MG tablet Take 1 tablet (5 mg total) by mouth daily. 30 tablet 6   No current facility-administered medications for this visit.    LABS/IMAGING: No results found for this or any previous visit (from the past 48 hour(s)). No results found.  VITALS: BP 130/96 mmHg  Pulse 88  Ht 5\' 7"  (1.702 m)  Wt 255 lb 12.8 oz (116.03 kg)  BMI 40.05 kg/m2  EXAM: Deferred  EKG: Deferred   ASSESSMENT: 1. Dyspnea and exertion 2. Palpitations 3. Abnormal  EKG with possible history of prior infarcts- no evidence for prior infarct and no ischemia by nuclear stress testing, EF 76% 4. Morbid obesity 5. Hypertension 6. Family history of premature coronary disease   PLAN: 1.   Mrs. Vanderkolk had a low risk stress test with no evidence of prior infarct, normal perfusion and EF 76%. She does have PVCs which are isolated. This could be the cause of her palpitations. As her blood pressure still not at goal and she had hypertensive response to exercise, I think she would benefit from a beta blocker. To gain more blood pressure lowering benefit, I  would recommend bystolic 5 mg daily in addition to her current regimen. We'll plan to see her back in 6 months or sooner as necessary.  Thanks again for the kind referral.   Chrystie Nose, MD, [facility] Attending Cardiologist [facility]  Demarie Hyneman C 09/25/2014, 4:32 PM

## 2014-09-29 ENCOUNTER — Other Ambulatory Visit: Payer: Self-pay

## 2014-09-29 MED ORDER — NEBIVOLOL HCL 5 MG PO TABS: 5.0000 mg | ORAL_TABLET | Freq: Every day | ORAL | Status: DC

## 2014-09-29 NOTE — Telephone Encounter (Signed)
Rx(s) sent to pharmacy electronically.  

## 2015-02-03 ENCOUNTER — Other Ambulatory Visit: Payer: Self-pay

## 2015-02-03 DIAGNOSIS — Z1231 Encounter for screening mammogram for malignant neoplasm of breast: Secondary | ICD-10-CM

## 2015-02-18 ENCOUNTER — Ambulatory Visit: Payer: BLUE CROSS/BLUE SHIELD

## 2015-03-16 ENCOUNTER — Telehealth: Payer: Self-pay | Admitting: Internal Medicine

## 2015-03-17 NOTE — Telephone Encounter (Signed)
Close encounter 

## 2015-04-06 ENCOUNTER — Ambulatory Visit: Payer: BLUE CROSS/BLUE SHIELD | Admitting: Internal Medicine

## 2015-04-07 ENCOUNTER — Ambulatory Visit: Payer: BLUE CROSS/BLUE SHIELD | Admitting: Internal Medicine

## 2015-04-10 ENCOUNTER — Ambulatory Visit (INDEPENDENT_AMBULATORY_CARE_PROVIDER_SITE_OTHER): Payer: BLUE CROSS/BLUE SHIELD | Admitting: Internal Medicine

## 2015-04-10 ENCOUNTER — Encounter: Payer: Self-pay | Admitting: Internal Medicine

## 2015-04-10 VITALS — BP 134/100 | HR 70 | Ht 66.5 in | Wt 252.1 lb

## 2015-04-10 DIAGNOSIS — E1169 Type 2 diabetes mellitus with other specified complication: Secondary | ICD-10-CM | POA: Diagnosis not present

## 2015-04-10 DIAGNOSIS — Z8249 Family history of ischemic heart disease and other diseases of the circulatory system: Secondary | ICD-10-CM

## 2015-04-10 DIAGNOSIS — I10 Essential (primary) hypertension: Secondary | ICD-10-CM | POA: Diagnosis not present

## 2015-04-10 DIAGNOSIS — R002 Palpitations: Secondary | ICD-10-CM

## 2015-04-10 DIAGNOSIS — E1165 Type 2 diabetes mellitus with hyperglycemia: Secondary | ICD-10-CM

## 2015-04-10 DIAGNOSIS — IMO0002 Reserved for concepts with insufficient information to code with codable children: Secondary | ICD-10-CM

## 2015-04-10 NOTE — Patient Instructions (Signed)
Dr Rennis Golden has made no changes to your current medications or treatment plan.  Dr Rennis Golden recommends that you schedule a follow-up appointment in 6 months. You will receive a reminder letter in the mail two months in advance. If you don't receive a letter, please call our office to schedule the follow-up appointment.  You have been provided with a brochure to [facility]. You need a 20-30 mmHg knee high compression stocking for the left leg.

## 2015-04-10 NOTE — Progress Notes (Signed)
OFFICE NOTE  Chief Complaint:  No complaints  Primary Care Physician: Verlon Au, MD  HPI:  Stephanie Barnett is a pleasant 64 year old female with past medical history significant for hypertension as well as a family history of heart disease whose brother died of a heart attack at age 64. She has coronary risk factors that also include morbid obesity with a BMI of 40. Recently she's been having some worsening palpitations or feeling of fluttering in her chest. She says it can occur almost any time and is not sterilely worse with exertion or relieved by rest. She does have a history of reflux. An EKG was performed in the office today which is abnormal demonstrating poor R-wave progression across the precordium as well as Q waves inferiorly into 3 and aVF concerning for either anterior or inferior infarcts. She denies any history of heart disease or prior heart attack. She said that she underwent stress testing which she was in [place] number of years ago, probably more than 10 years ago.  I saw Stephanie Barnett back in the office today. She did very well on her exercise nuclear stress test. Reportedly she had a hypertensive response to exercise. Perfusion was normal and EF was normal at 76%. She wore a Holter monitor which demonstrated some periods of sinus tachycardia as well as PVCs and one ventricular couplet. This may or may not be causing her symptoms. It appears her blood pressure still not at goal with blood pressure today 130/96.  Stephanie Barnett returns today for follow-up. Overall she seems to be doing well. She's tolerating bystolic and this seems to be helping her blood pressure. Initially was 134/100 however recheck was 124/80. She occasionally gets some dizziness but not with positional changes. She is taking Lasix in addition to her losartan HCTZ for swelling. It is noted that she does have some varicose veins and signs of chronic venous insufficiency in her left leg. This is a leg  she says swells more than the right.  PMHx:  Past Medical History  Diagnosis Date  . Hypertension     Past Surgical History  Procedure Laterality Date  . Vesicovaginal fistula closure w/ tah    . Cesearan    . Cholecystectomy    . Abdominal hysterectomy    . Knee surgery Left 01/2013    Torn Meniscus    FAMHx:  Family History  Problem Relation Age of Onset  . Heart attack Brother 56  . Pulmonary embolism Father     COD after total hip replacment  . Heart disease Paternal Grandfather   . Heart disease Paternal Grandmother   . Hypertension Mother   . Arthritis/Rheumatoid Brother     SOCHx:   reports that she has never smoked. She has never used smokeless tobacco. She reports that she does not drink alcohol or use illicit drugs.  ALLERGIES:  No Known Allergies  ROS: A comprehensive review of systems was negative.  HOME MEDS: Current Outpatient Prescriptions  Medication Sig Dispense Refill  . aspirin 325 MG tablet Take 1 tablet (325 mg total) by mouth daily. 30 tablet 0  . furosemide (LASIX) 20 MG tablet Take 20 mg by mouth daily as needed.  1  . losartan-hydrochlorothiazide (HYZAAR) 50-12.5 MG per tablet Take 1 tablet by mouth daily.    . metFORMIN (GLUCOPHAGE) 500 MG tablet Take 2 tablets by mouth 2 (two) times daily.  0  . Multiple Vitamin (MULTIVITAMIN WITH MINERALS) TABS Take 1 tablet by mouth daily.    Marland Kitchen  nebivolol (BYSTOLIC) 5 MG tablet Take 1 tablet (5 mg total) by mouth daily. 90 tablet 3   No current facility-administered medications for this visit.    LABS/IMAGING: No results found for this or any previous visit (from the past 48 hour(s)). No results found.  VITALS: BP 134/100 mmHg  Pulse 70  Ht 5' 6.5" (1.689 m)  Wt 252 lb 1.6 oz (114.352 kg)  BMI 40.09 kg/m2  EXAM: General appearance: alert and no distress Lungs: clear to auscultation bilaterally Heart: regular rate and rhythm, S1, S2 normal, no murmur, click, rub or gallop Extremities: edema  Trace to 1+ left lower extremity, varicose veins noted and venous stasis dermatitis noted Skin: Skin color, texture, turgor normal. No rashes or lesions Neurologic: Grossly normal  EKG: Sinus rhythm with PACs at 70, poor R-wave progression, unchanged  ASSESSMENT: 1. Dyspnea and exertion 2. Palpitations - PACs 3. Abnormal EKG with poor R-wave progression anteriorly - no evidence for prior infarct and no ischemia by nuclear stress testing, EF 76% 4. Morbid obesity 5. Hypertension 6. Family history of premature coronary disease 7. Type 2 diabetes-uncontrolled   PLAN: 1.   Stephanie Barnett seems to be doing well. I added Bystolic to her current regimen and she says that she does not feel any different. Blood pressure however is a little better control. She still having some PACs. She needs to work on weight loss and better glycemic control. She says her blood sugars are typically running in the 150s on metformin. I did discuss her left leg swelling which is likely due to venous insufficiency. There are chronic venous stasis changes. 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 We gave her brochure and to Lasix therapy in [place]. She should get a 20-30 mmHg knee-high compression stocking to wear on her left leg.  Plan to see her back in 6 months at her request.  Chrystie Nose, MD, [facility] Attending Cardiologist [facility]  Chrystie Nose 04/10/2015, 4:30 PM

## 2015-04-16 ENCOUNTER — Ambulatory Visit
Admission: RE | Admit: 2015-04-16 | Discharge: 2015-04-16 | Disposition: A | Discharge status: Home / Self Care | Payer: BLUE CROSS/BLUE SHIELD | Source: Ambulatory Visit

## 2015-04-16 DIAGNOSIS — Z1231 Encounter for screening mammogram for malignant neoplasm of breast: Secondary | ICD-10-CM

## 2015-05-14 ENCOUNTER — Encounter: Payer: Self-pay | Admitting: Family Medicine

## 2015-06-01 ENCOUNTER — Emergency Department (HOSPITAL_COMMUNITY)
Admission: EM | Admit: 2015-06-01 | Discharge: 2015-06-01 | Disposition: A | Discharge status: Home / Self Care | Payer: BLUE CROSS/BLUE SHIELD | Source: Home / Self Care | Attending: Family Medicine | Admitting: Family Medicine

## 2015-06-01 ENCOUNTER — Emergency Department (INDEPENDENT_AMBULATORY_CARE_PROVIDER_SITE_OTHER): Payer: BLUE CROSS/BLUE SHIELD

## 2015-06-01 ENCOUNTER — Encounter (HOSPITAL_COMMUNITY): Payer: Self-pay | Admitting: Emergency Medicine

## 2015-06-01 DIAGNOSIS — T148 Other injury of unspecified body region: Secondary | ICD-10-CM | POA: Diagnosis not present

## 2015-06-01 DIAGNOSIS — T07XXXA Unspecified multiple injuries, initial encounter: Secondary | ICD-10-CM

## 2015-06-01 NOTE — Discharge Instructions (Signed)
Activity as tolerated, warm soaks for stiffness and swelling. Return as needed.

## 2015-06-01 NOTE — ED Notes (Signed)
Patient fell Tuesday 11/22.  Patient tripped over a curb. Patient landed on concrete.  Right foot, right arm, and left black eye, and glasses did break

## 2015-06-01 NOTE — ED Provider Notes (Signed)
CSN: [id number]     Arrival date & time 06/01/15  1307 History   First MD Initiated Contact with Patient 06/01/15 1420     Chief Complaint  Patient presents with  . Fall   (Consider location/radiation/quality/duration/timing/severity/associated sxs/prior Treatment) Patient is a 64 y.o. female presenting with fall. The history is provided by the patient.  Fall This is a new problem. The current episode started more than 1 week ago (fell off curb in [place] last tues with left orbital injury, right arm and foot injury, no loc, no back pain,). The problem has not changed since onset.Pertinent negatives include no chest pain, no abdominal pain, no headaches and no shortness of breath.    Past Medical History  Diagnosis Date  . Hypertension    Past Surgical History  Procedure Laterality Date  . Vesicovaginal fistula closure w/ tah    . Cesearan    . Cholecystectomy    . Abdominal hysterectomy    . Knee surgery Left 01/2013    Torn Meniscus   Family History  Problem Relation Age of Onset  . Heart attack Brother 56  . Pulmonary embolism Father     COD after total hip replacment  . Heart disease Paternal Grandfather   . Heart disease Paternal Grandmother   . Hypertension Mother   . Arthritis/Rheumatoid Brother    Social History  Substance Use Topics  . Smoking status: Never Smoker   . Smokeless tobacco: Never Used  . Alcohol Use: No   OB History    No data available     Review of Systems  Constitutional: Negative.   Eyes: Positive for redness.  Respiratory: Negative for shortness of breath.   Cardiovascular: Negative.  Negative for chest pain.  Gastrointestinal: Negative.  Negative for abdominal pain.  Musculoskeletal: Positive for gait problem. Negative for back pain and joint swelling.  Skin: Negative.   Neurological: Negative for numbness and headaches.  All other systems reviewed and are negative.   Allergies  Review of patient's allergies indicates no known  allergies.  Home Medications   Prior to Admission medications   Medication Sig Start Date End Date Taking? Authorizing Provider  aspirin 325 MG tablet Take 1 tablet (325 mg total) by mouth daily. 08/02/13   Linwood DibblesJon Knapp, MD  furosemide (LASIX) 20 MG tablet Take 20 mg by mouth daily as needed. 09/24/14   Historical Provider, MD  losartan-hydrochlorothiazide (HYZAAR) 50-12.5 MG per tablet Take 1 tablet by mouth daily. 07/23/14 07/23/15  Historical Provider, MD  metFORMIN (GLUCOPHAGE) 500 MG tablet Take 2 tablets by mouth 2 (two) times daily. 01/28/15   Historical Provider, MD  Multiple Vitamin (MULTIVITAMIN WITH MINERALS) TABS Take 1 tablet by mouth daily.    Historical Provider, MD  nebivolol (BYSTOLIC) 5 MG tablet Take 1 tablet (5 mg total) by mouth daily. 09/29/14   Chrystie NoseKenneth C Hilty, MD   Meds Ordered and Administered this Visit  Medications - No data to display  BP 125/80 mmHg  Pulse 78  Temp(Src) 98.3 F (36.8 C) (Oral)  Resp 16  SpO2 95% No data found.   Physical Exam  Constitutional: She is oriented to person, place, and time. She appears well-developed and well-nourished. No distress.  HENT:  Head: Normocephalic.  Left orbital rim ecchymosis and small left orbital subconjuntival hemorrhage. No hyphema, perrla.  Musculoskeletal: She exhibits tenderness.       Arms:      Right foot: There is tenderness and swelling.  Feet:  Neurological: She is alert and oriented to person, place, and time.  Skin: Skin is warm and dry.  Nursing note and vitals reviewed.   ED Course  Procedures (including critical care time)  Labs Review Labs Reviewed - No data to display  Imaging Review Dg Humerus Right  06/01/2015  CLINICAL DATA:  Pain following fall 1 week prior EXAM: RIGHT HUMERUS - 2+ VIEW COMPARISON:  None. FINDINGS: Frontal and lateral views were obtained. A small focus of calcification slightly superior to the glenoid probably represents calcific tendinosis. A small avulsion in  this area, however, is possible. No other evidence of potential fracture. No dislocation. No appreciable joint space narrowing. Joint spaces appear intact. 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 This focal calcification could possibly represent a small avulsion injury, however. No other evidence of potential fracture. No dislocation. No appreciable joint space narrowing. Electronically Signed   By: Bretta Bang III M.D.   On: 06/01/2015 15:05   Dg Foot Complete Right  06/01/2015  CLINICAL DATA:  Right foot pain following a fall on 05/26/2015. EXAM: RIGHT FOOT COMPLETE - 3+ VIEW COMPARISON:  None. FINDINGS: Mild inferior calcaneal spur formation. Mild dorsal metatarsal/tarsal spur formation. Soft tissue swelling adjacent to the fifth MTP joint. No fracture or dislocation seen. IMPRESSION: No fracture.  Mild degenerative changes. Electronically Signed   By: Beckie Salts M.D.   On: 06/01/2015 14:57   X-rays reviewed and report per radiologist.   Visual Acuity Review  Right Eye Distance:   Left Eye Distance:   Bilateral Distance:    Right Eye Near:   Left Eye Near:    Bilateral Near:         MDM   1. Multiple contusions        Linna Hoff, MD 06/01/15 1537

## 2015-08-10 ENCOUNTER — Telehealth: Payer: Self-pay

## 2015-08-10 NOTE — Telephone Encounter (Signed)
Request for surgical clearance:  1. What type of surgery is being performed? Right shoulder: right shoulder scope, A-SAD, bi tenodesis, mini-open RCR, co-plane clavicle, BICEPS TENODESIS-open chronic/over 3 months, Open resection distal clavical   2. When is this surgery scheduled? pending   3. Are there any medications that need to be held prior to surgery and how long? N/A - patient is taking ASA 325 mg   4. Name of physician performing surgery? Dr Malon Kindle   5. What is your office phone and fax number? [facility] Phone - [phone] Fax - [phone]    **Dr Rennis Golden, you saw this patient in October 2016.

## 2015-08-11 NOTE — Telephone Encounter (Signed)
Low risk stress test .. Cleared for surgery. If aspirin needs to be held, can hold for 7 days prior to surgery.  DR. Rexene Edison

## 2015-08-11 NOTE — Telephone Encounter (Signed)
Note faxed to Jacki Cones at [facility].

## 2015-10-19 ENCOUNTER — Encounter: Payer: Self-pay | Admitting: Physician Assistant

## 2015-10-19 ENCOUNTER — Ambulatory Visit (INDEPENDENT_AMBULATORY_CARE_PROVIDER_SITE_OTHER): Payer: BLUE CROSS/BLUE SHIELD | Admitting: Physician Assistant

## 2015-10-19 VITALS — BP 140/90 | HR 75 | Ht 66.5 in | Wt 254.8 lb

## 2015-10-19 DIAGNOSIS — I10 Essential (primary) hypertension: Secondary | ICD-10-CM | POA: Diagnosis not present

## 2015-10-19 DIAGNOSIS — Z8249 Family history of ischemic heart disease and other diseases of the circulatory system: Secondary | ICD-10-CM

## 2015-10-19 MED ORDER — NEBIVOLOL HCL 10 MG PO TABS: 10.0000 mg | ORAL_TABLET | Freq: Every day | ORAL | Status: AC

## 2015-10-19 NOTE — Patient Instructions (Signed)
Jacolyn ReedyMichele Lenze, PA-C, has recommended making the following medication changes: 1. INCREASE Bystolic to 10 mg

## 2015-10-19 NOTE — Progress Notes (Signed)
Cardiology Office Note    Date:  10/19/2015   ID:  Stephanie Barnett, DOB [birth year], MRN [id number]  PCP:  Verlon Au, MD  Cardiologist:Dr.  Rennis Golden   vhief complaint hypertension  History of Present Illness:  Stephanie Barnett is a 65 y.o. female history of hypertension, strong family history of heart disease whose brother died of an MI at 50. 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 She underwent nuclear stress test 09/2014 and had a hypertensive response to exercise. Perfusion was normal and EF was 76%. Ultra monitor showed some periods of sinus tachycardia as well as one ventricular couplet that she was asymptomatic with. She was started on by [facility].  Patient comes in today for six-month follow-up. She is moving to [place]. She checks her blood pressure regularly and it's usually about 140/85-90. She watches her sodium intake closely. She recently had rotator cuffs surgery and is still in a splint. He denies chest pain, palpitations, dyspnea, dyspnea on exertion, dizziness or presyncope.    Past Medical History  Diagnosis Date  . Hypertension     Past Surgical History  Procedure Laterality Date  . Vesicovaginal fistula closure w/ tah    . Cesearan    . Cholecystectomy    . Abdominal hysterectomy    . Knee surgery Left 01/2013    Torn Meniscus    Current Medications: Outpatient Prescriptions Prior to Visit  Medication Sig Dispense Refill  . aspirin 325 MG tablet Take 1 tablet (325 mg total) by mouth daily. 30 tablet 0  . furosemide (LASIX) 20 MG tablet Take 20 mg by mouth daily as needed for fluid or edema.   1  . losartan-hydrochlorothiazide (HYZAAR) 50-12.5 MG per tablet Take 1 tablet by mouth daily.    . metFORMIN (GLUCOPHAGE) 500 MG tablet Take 2 tablets by mouth 2 (two) times daily.  0  . Multiple Vitamin (MULTIVITAMIN WITH MINERALS) TABS Take 1 tablet by mouth daily.    . nebivolol (BYSTOLIC) 5 MG tablet Take 1 tablet (5  mg total) by mouth daily. 90 tablet 3   No facility-administered medications prior to visit.     Allergies:   Review of patient's allergies indicates no known allergies.   Social History   Social History  . Marital Status: Married    Spouse Name: N/A  . Number of Children: N/A  . Years of Education: N/A   Social History Main Topics  . Smoking status: Never Smoker   . Smokeless tobacco: Never Used  . Alcohol Use: No  . Drug Use: No  . Sexual Activity: Yes   Other Topics Concern  . None   Social History Narrative     Family History:  The patient's    family history includes Arthritis/Rheumatoid in her brother; Heart attack (age of onset: 24) in her brother; Heart disease in her paternal grandfather and paternal grandmother; Hypertension in her mother; Pulmonary embolism in her father.   ROS:   Please see the history of present illness.    Review of Systems  Constitution: Negative.  HENT: Negative.   Cardiovascular: Negative.   Respiratory: Negative.   Musculoskeletal: Positive for joint pain.  Gastrointestinal: Negative.   Genitourinary: Negative.   Neurological: Negative.    All other systems reviewed and are negative.   PHYSICAL EXAM:   VS:  BP 140/90 mmHg  Pulse 75  Ht 5' 6.5" (1.689 m)  Wt 254 lb 12.8 oz (115.577 kg)  BMI 40.51  kg/m2   GEN: Obese in no acute distress HEENT: normal Neck: no JVD, carotid bruits, or masses Cardiac:  RRR; positive S4, 2/6 systolic murmur at the left sternal border, no gallops,no edema  Respiratory:  clear to auscultation bilaterally, normal work of breathing GI: soft, nontender, nondistended, + BS MS: no deformity or atrophy Skin: warm and dry, no rash Neuro:  Alert and Oriented x 3, Strength and sensation are intact   Wt Readings from Last 3 Encounters:  10/19/15 254 lb 12.8 oz (115.577 kg)  04/10/15 252 lb 1.6 oz (114.352 kg)  09/25/14 255 lb 12.8 oz (116.03 kg)      Studies/Labs Reviewed:   EKG:  EKG is  ordered  today.  The ekg ordered today demonstrates Normal sinus rhythm with inferior Q waves and poor R-wave progression anteriorly  Recent Labs: No results found for requested labs within last 365 days.   Lipid Panel No results found for: CHOL, TRIG, HDL, CHOLHDL, VLDL, LDLCALC, LDLDIRECT  Additional studies/ records that were reviewed today include:  Overall Impression:  Low risk stress nuclear study with small, mild intensity apical lateral mostly fixed defect which is likely breast attenuation artifact. Hypertensive response to exercise - no change in PVC''s with exercise.  LV Wall Motion:  NL LV Function; NL Wall Motion; EF 76%  Chrystie NoseKenneth C. Hilty, MD, [facility] HospitalFACC Board Certified in Nuclear Cardiology Attending Cardiologist [facility]  Chrystie NoseHILTY,Kenneth C, MD  09/05/2014 11:46 AM     ASSESSMENT:    1. Essential hypertension   2. Morbid obesity, unspecified obesity type (HCC)   3. Family history of heart disease      PLAN:  Hypertension patient's blood pressure is elevated today. Increase diastolic to 10 mg daily  Family history of CAD with normal nuclear stress test 09/2014. Copies of been given to patient of her last office note and EKG to take with her and establish [place] primary care doctor in [place]  Obesity weight loss recommended    Medication Adjustments/Labs and Tests Ordered: Current medicines are reviewed at length with the patient today.  Concerns regarding medicines are outlined above.  Medication changes, Labs and Tests ordered today are listed in the Patient Instructions below. Patient Instructions  Jacolyn ReedyMichele Mildreth Reek, PA-C, has recommended making the following medication changes: 1. INCREASE Bystolic to 10 mg        Elson ClanSigned, Meryem Haertel, PA-C  10/19/2015 2:21 PM    [facility] [address], [place], [place]  [address] Phone: [phone]; Fax: [phone]

## 2017-02-24 IMAGING — DX DG HUMERUS 2V *R*
3 series · 3 of 3 positions shown · non-contrast
Comparison: None.

CLINICAL DATA: Pain following fall 1 week prior

EXAM:
RIGHT HUMERUS - 2+ VIEW

[humerus ap]
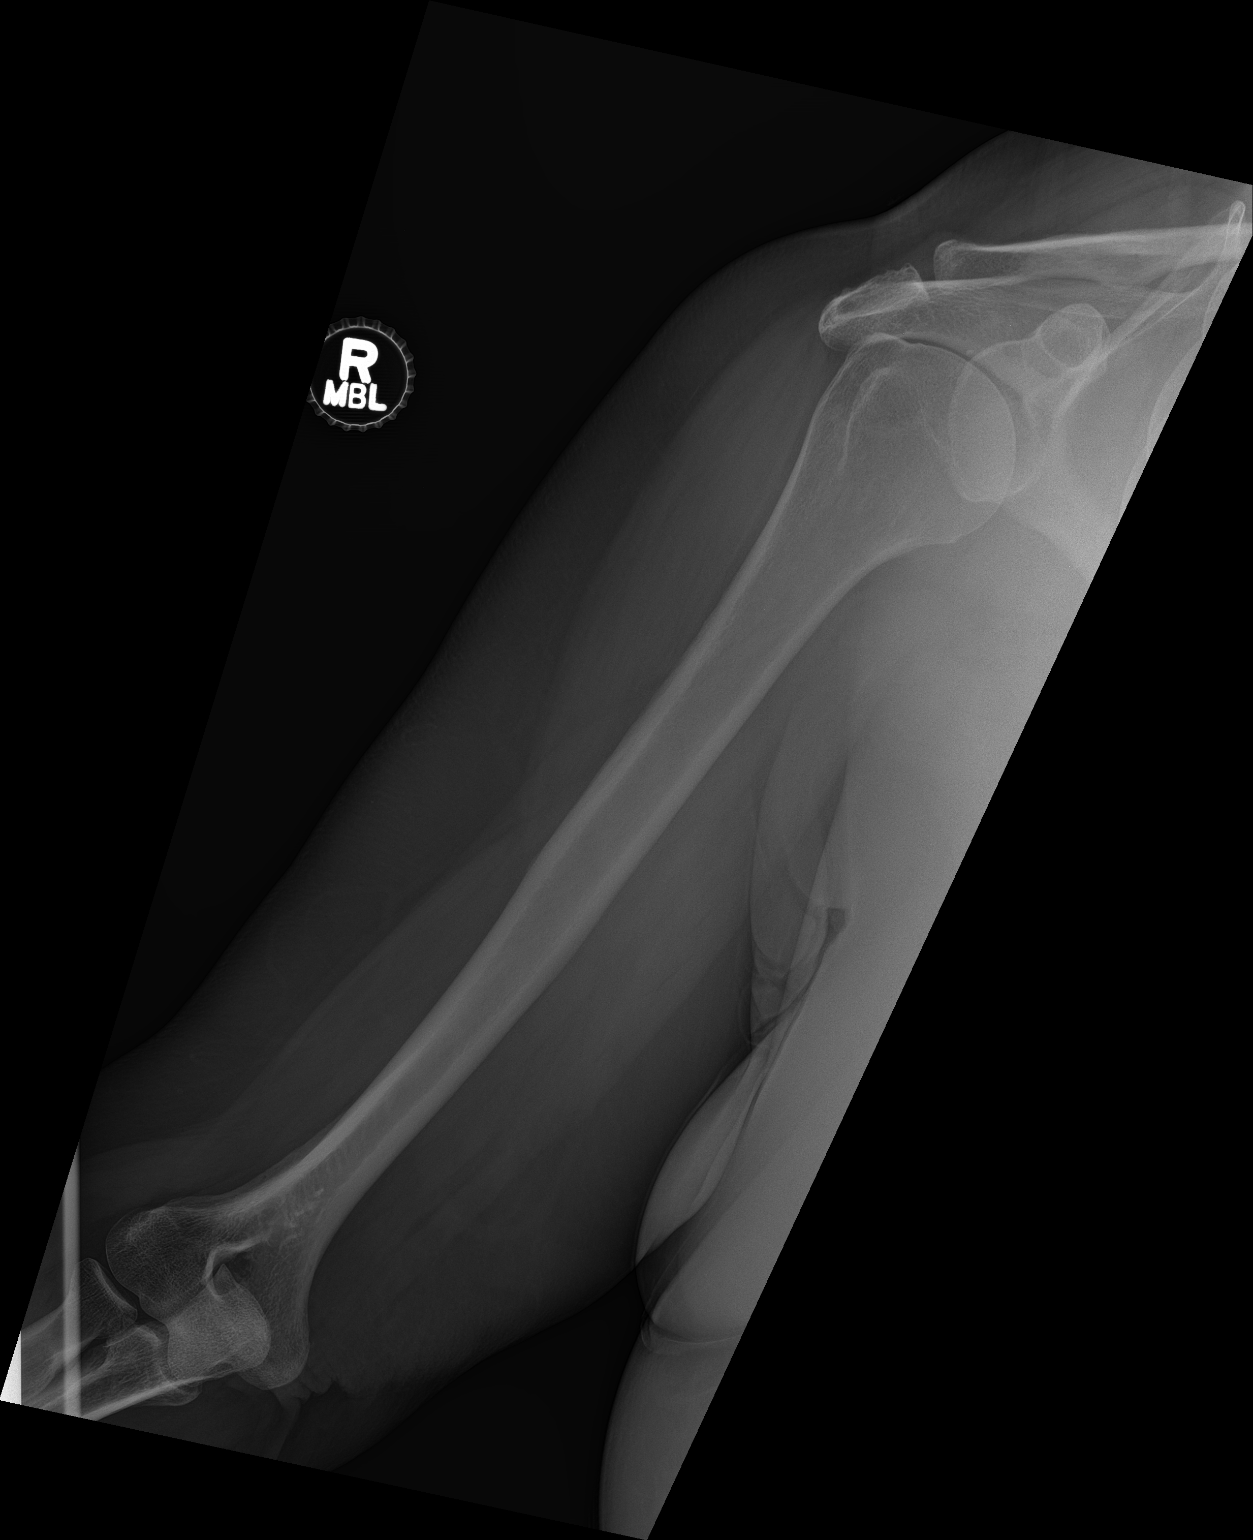

[humerus lat]
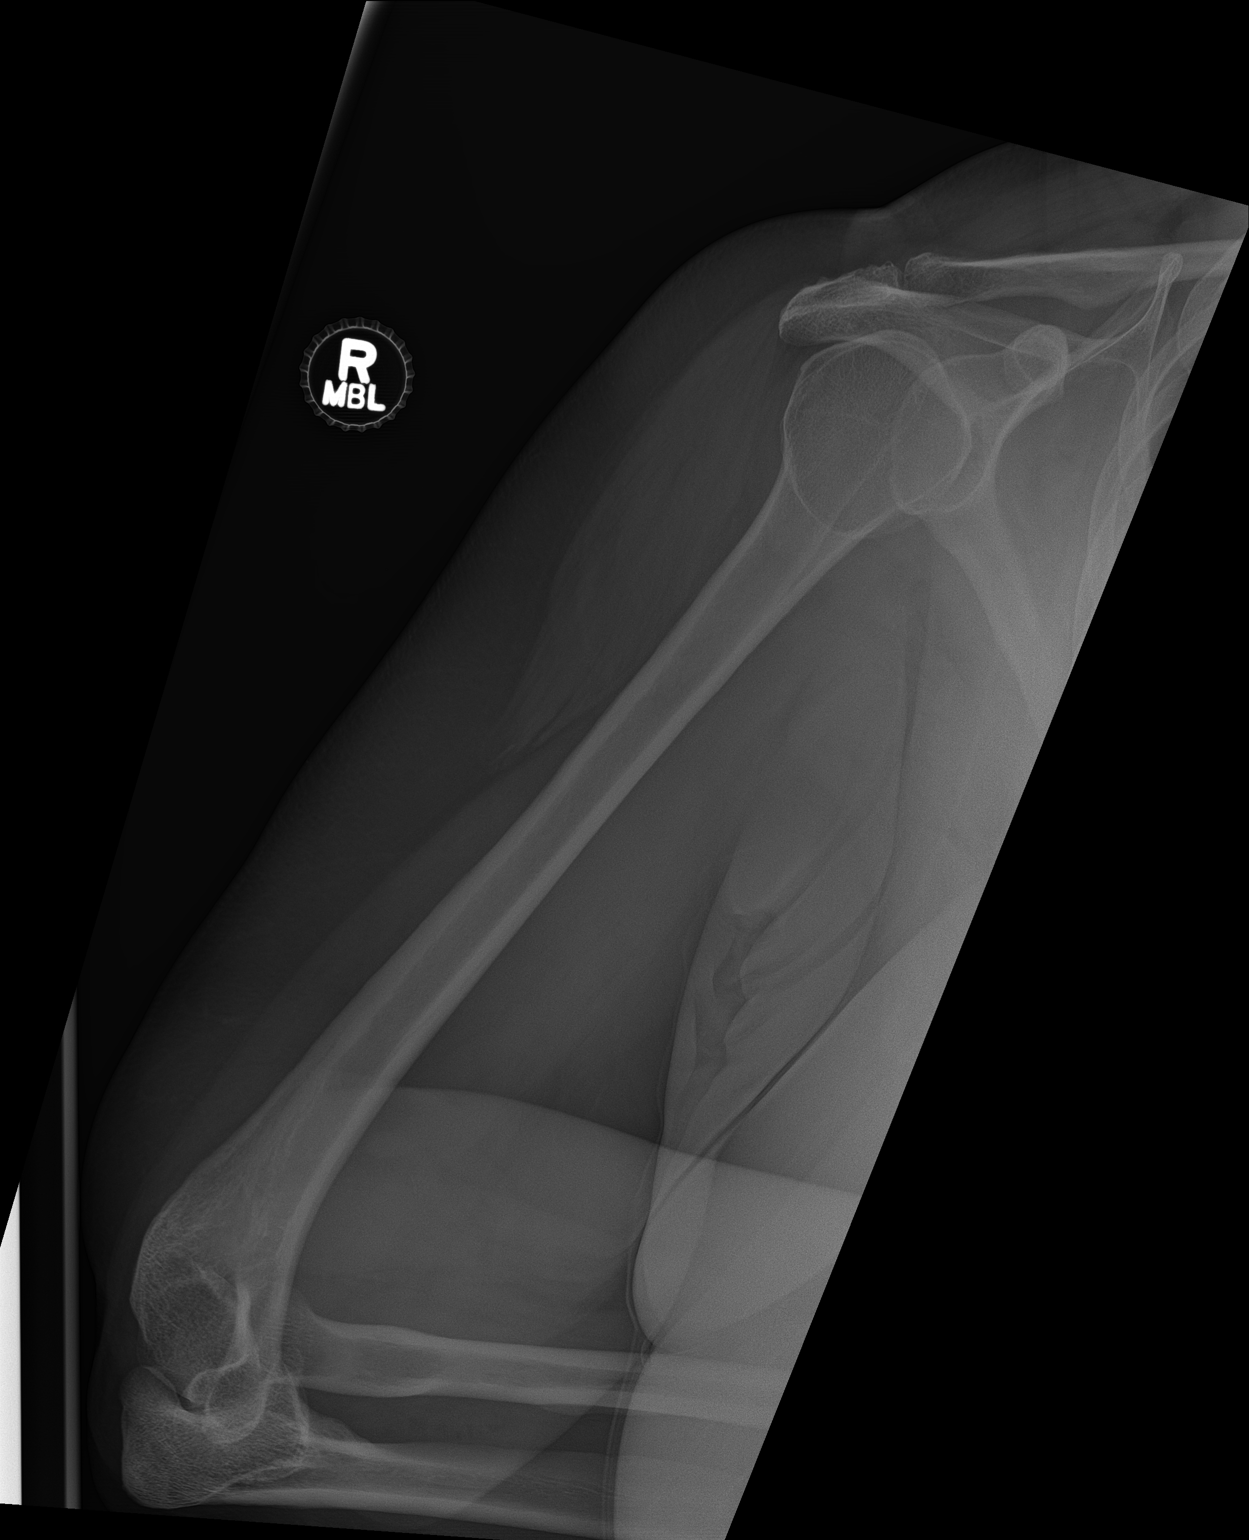

[shoulder ap]
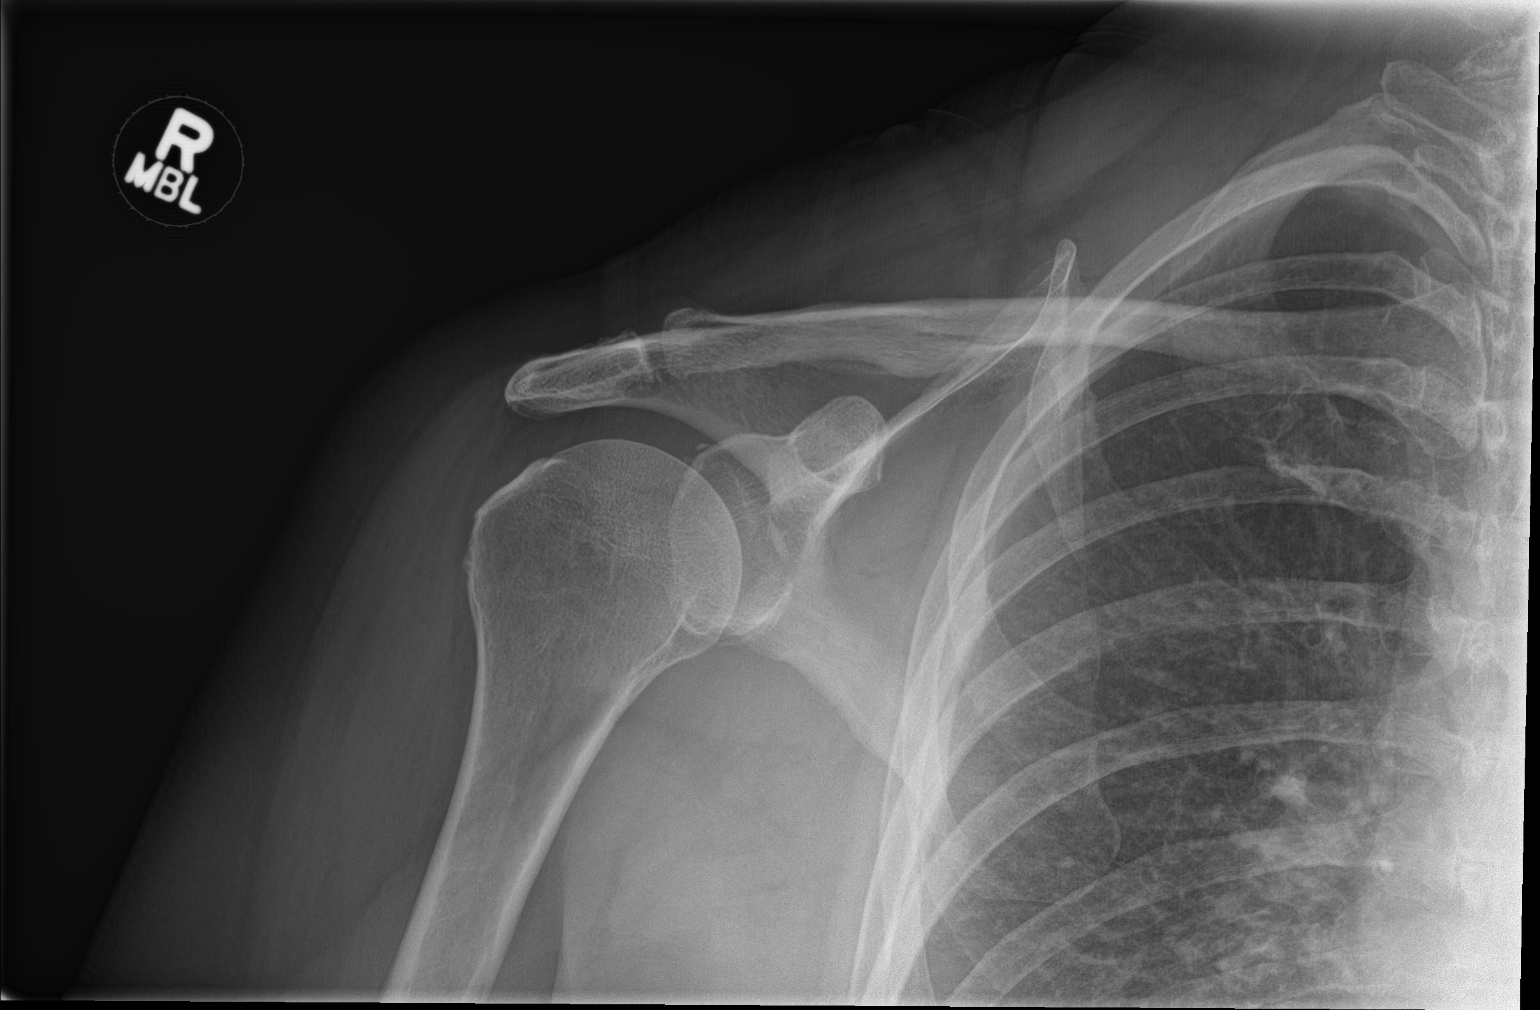

[3 of 3 positions shown; findings below may reference images not displayed]

FINDINGS: Frontal and lateral views were obtained. A small focus of
calcification slightly superior to the glenoid probably represents
calcific tendinosis. A small avulsion in this area, however, is
possible. No other evidence of potential fracture. No dislocation.
No appreciable joint space narrowing. Joint spaces appear intact.
IMPRESSION: Probable calcific tendinosis slightly superior to the glenoid. This
focal calcification could possibly represent a small avulsion
injury, however. No other evidence of potential fracture. No
dislocation. No appreciable joint space narrowing.
